# Patient Record
Sex: Female | Born: 1980 | Race: Black or African American | Hispanic: No | Marital: Single | Smoking: Current every day smoker
Health system: Southern US, Community
[De-identification: ages and names within clinical notes are randomized; demographics above are authoritative.]

---
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
## 1999-08-12 ENCOUNTER — Other Ambulatory Visit: Admission: RE | Admit: 1999-08-12 | Discharge: 1999-08-12 | Payer: Self-pay | Admitting: *Deleted

## 2000-01-31 ENCOUNTER — Inpatient Hospital Stay (HOSPITAL_COMMUNITY): Admission: AD | Admit: 2000-01-31 | Discharge: 2000-01-31 | Payer: Self-pay | Admitting: *Deleted

## 2000-02-24 ENCOUNTER — Inpatient Hospital Stay (HOSPITAL_COMMUNITY): Admission: AD | Admit: 2000-02-24 | Discharge: 2000-02-24 | Payer: Self-pay | Admitting: Pediatrics

## 2000-03-02 ENCOUNTER — Inpatient Hospital Stay (HOSPITAL_COMMUNITY): Admission: AD | Admit: 2000-03-02 | Discharge: 2000-03-04 | Payer: Self-pay | Admitting: Obstetrics and Gynecology

## 2001-03-09 ENCOUNTER — Emergency Department (HOSPITAL_COMMUNITY): Admission: EM | Admit: 2001-03-09 | Discharge: 2001-03-09 | Payer: Self-pay

## 2001-03-11 ENCOUNTER — Emergency Department (HOSPITAL_COMMUNITY): Admission: EM | Admit: 2001-03-11 | Discharge: 2001-03-11 | Payer: Self-pay

## 2001-03-13 ENCOUNTER — Emergency Department (HOSPITAL_COMMUNITY): Admission: EM | Admit: 2001-03-13 | Discharge: 2001-03-13 | Payer: Self-pay | Admitting: Emergency Medicine

## 2001-06-05 ENCOUNTER — Emergency Department (HOSPITAL_COMMUNITY): Admission: EM | Admit: 2001-06-05 | Discharge: 2001-06-05 | Payer: Self-pay | Admitting: Emergency Medicine

## 2003-01-25 ENCOUNTER — Emergency Department (HOSPITAL_COMMUNITY): Admission: EM | Admit: 2003-01-25 | Discharge: 2003-01-25 | Payer: Self-pay

## 2004-06-15 ENCOUNTER — Ambulatory Visit (HOSPITAL_BASED_OUTPATIENT_CLINIC_OR_DEPARTMENT_OTHER): Admission: RE | Admit: 2004-06-15 | Discharge: 2004-06-15 | Payer: Self-pay | Admitting: Ophthalmology

## 2007-03-20 ENCOUNTER — Ambulatory Visit (HOSPITAL_COMMUNITY): Admission: RE | Admit: 2007-03-20 | Discharge: 2007-03-20 | Payer: Self-pay | Admitting: Obstetrics & Gynecology

## 2007-06-29 ENCOUNTER — Ambulatory Visit (HOSPITAL_COMMUNITY): Admission: RE | Admit: 2007-06-29 | Discharge: 2007-06-29 | Payer: Self-pay | Admitting: Obstetrics & Gynecology

## 2007-08-18 ENCOUNTER — Inpatient Hospital Stay (HOSPITAL_COMMUNITY): Admission: AD | Admit: 2007-08-18 | Discharge: 2007-08-20 | Payer: Self-pay | Admitting: Obstetrics & Gynecology

## 2007-08-18 ENCOUNTER — Ambulatory Visit: Payer: Self-pay | Admitting: Gynecology

## 2007-08-19 ENCOUNTER — Encounter: Payer: Self-pay | Admitting: Obstetrics and Gynecology

## 2008-11-30 IMAGING — US US OB LIMITED
1 series · 14 of 28 positions shown · non-contrast
Comparison: none

OBSTETRICAL ULTRASOUND:

 This ultrasound exam was performed in the [HOSPITAL] Ultrasound Department.  The OB US report was generated in the AS system, and faxed to the ordering physician.  This report is also available in [REDACTED] PACS.

[Series 1: us ob limited · 0.30mm/px · 14 of 51 slices shown]
[im 2/51]
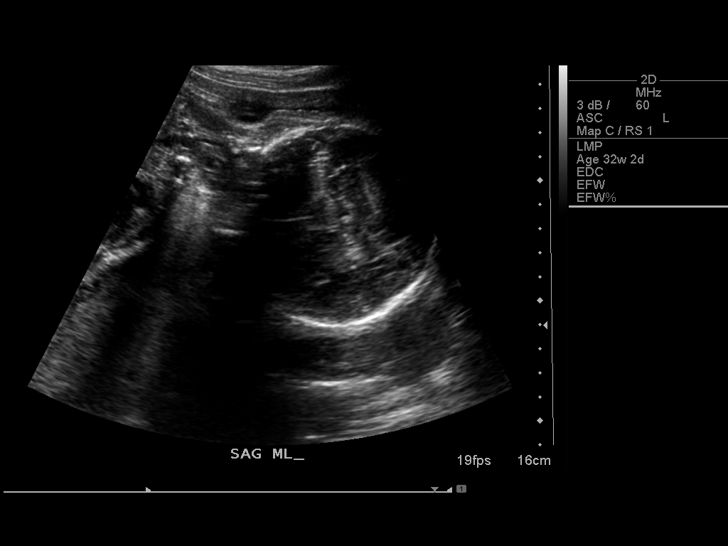
[im 6/51]
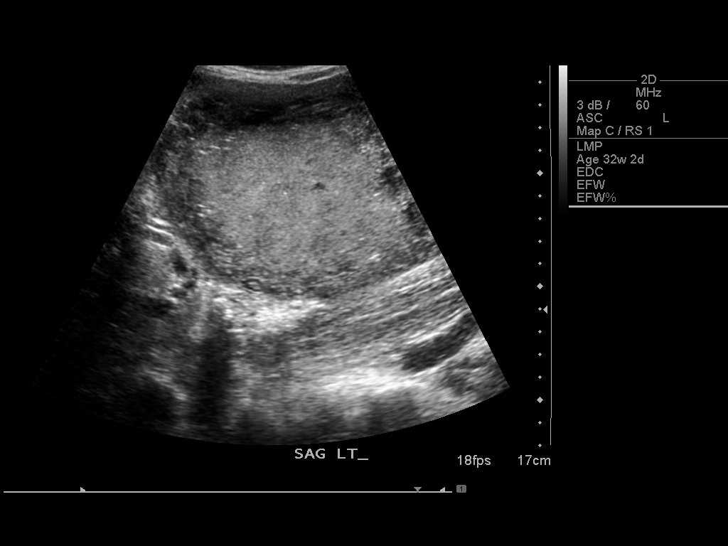
[im 10/51]
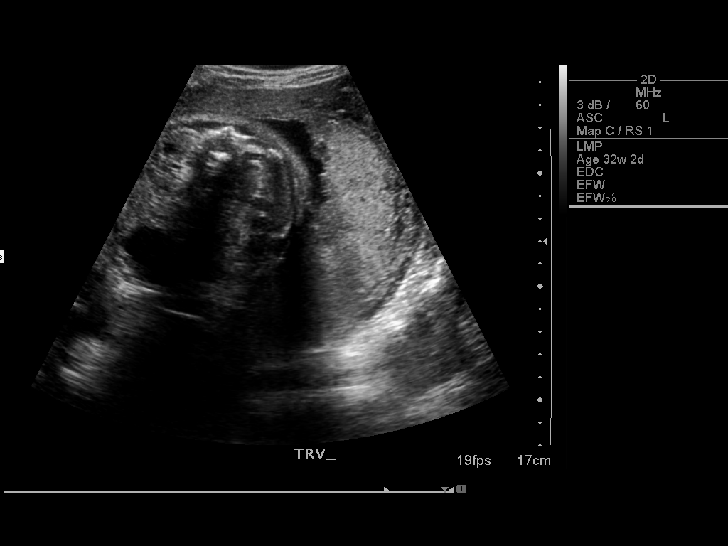
[im 13/51]
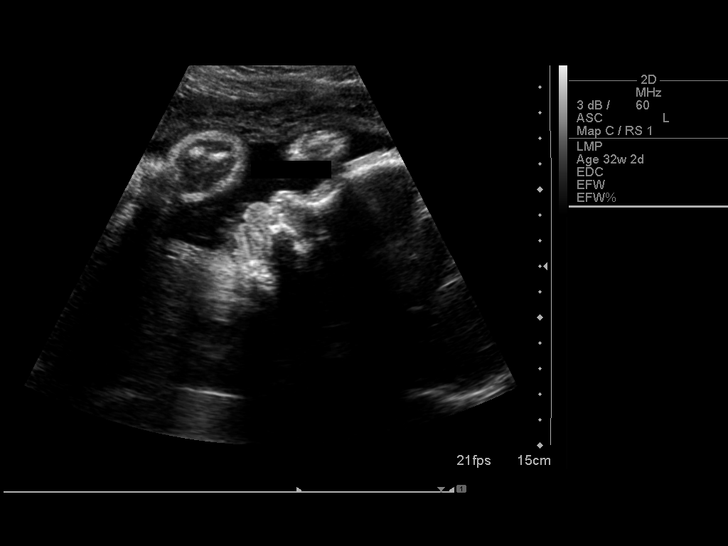
[im 17/51]
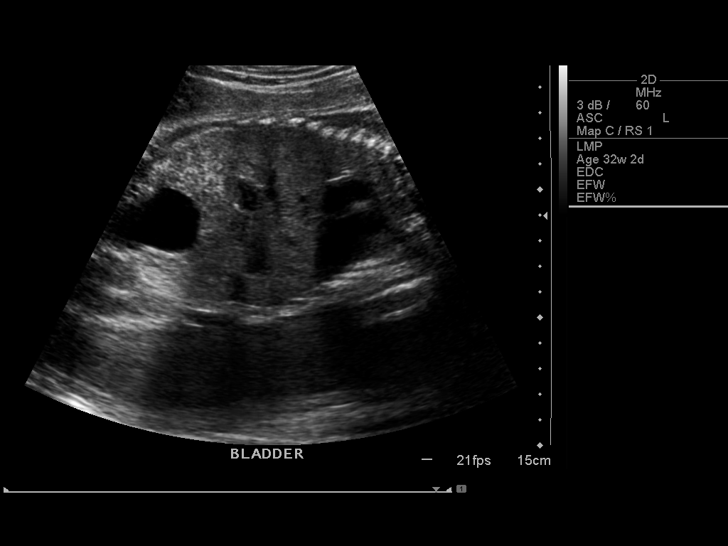
[im 21/51]
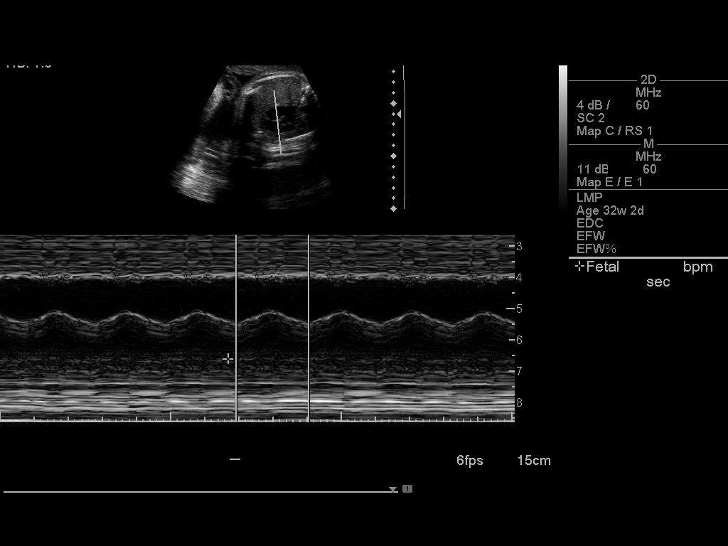
[im 25/51]
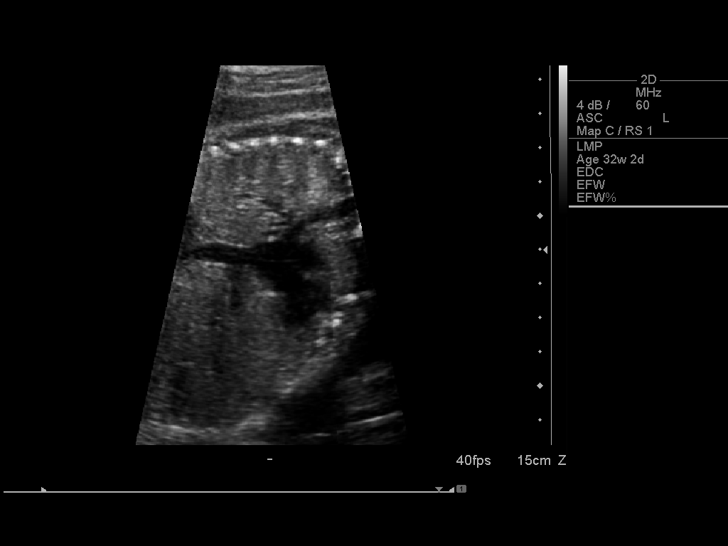
[im 28/51]
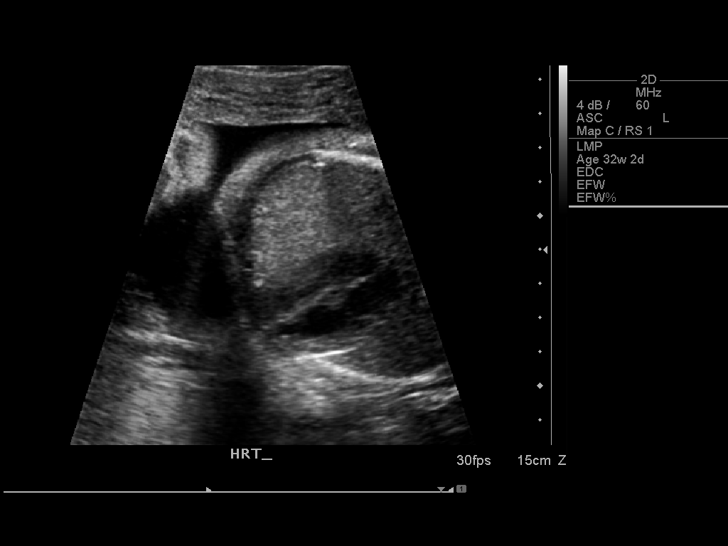
[im 32/51]
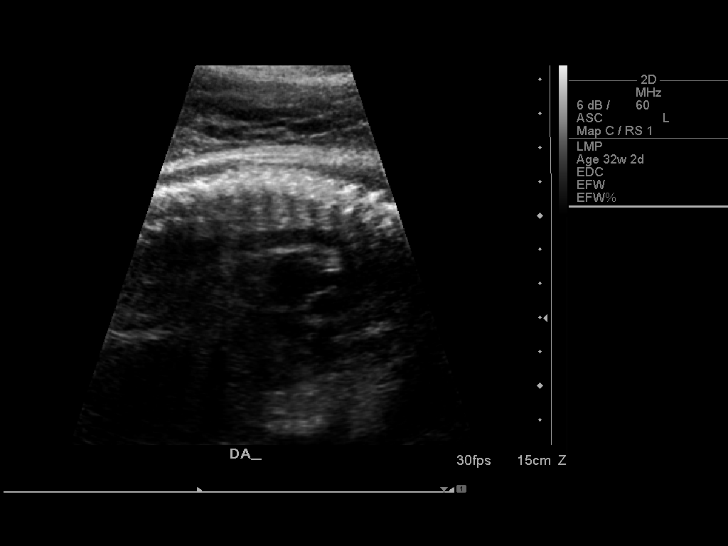
[im 36/51]
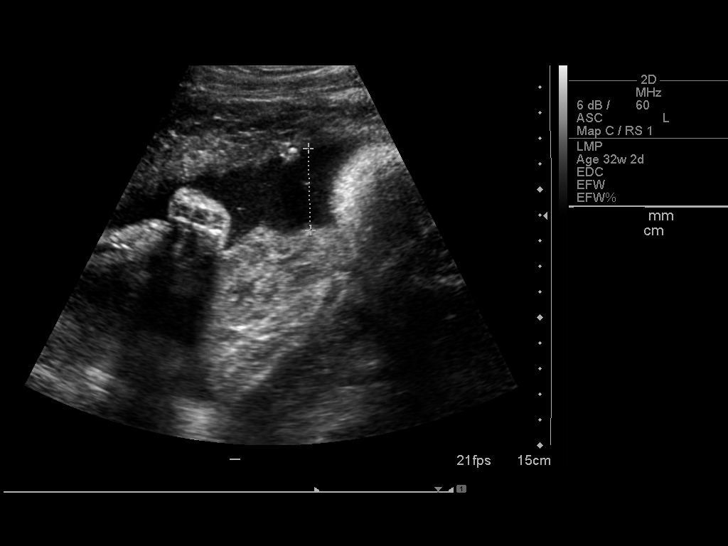
[im 39/51]
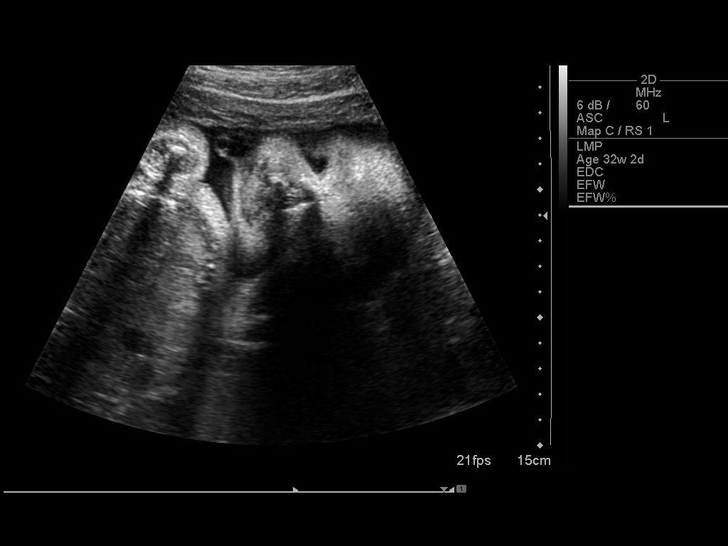
[im 43/51]
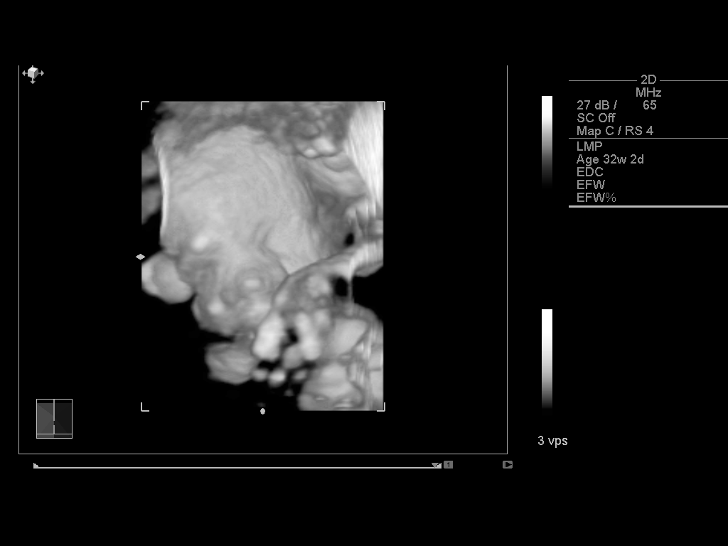
[im 47/51]
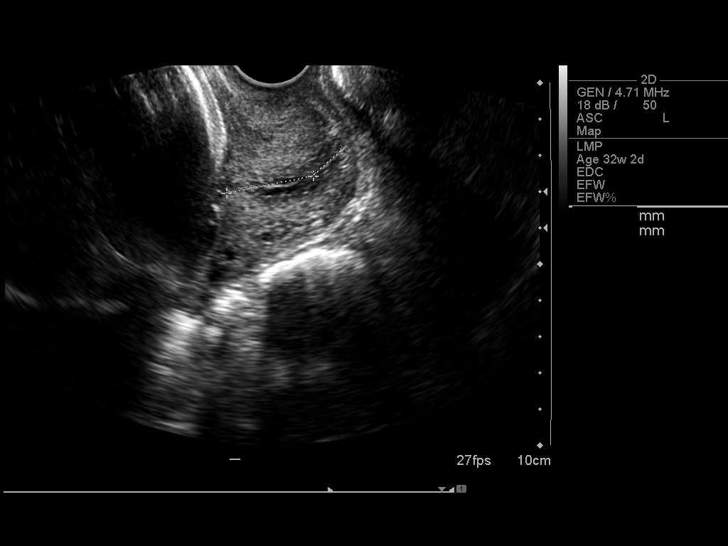
[im 51/51]
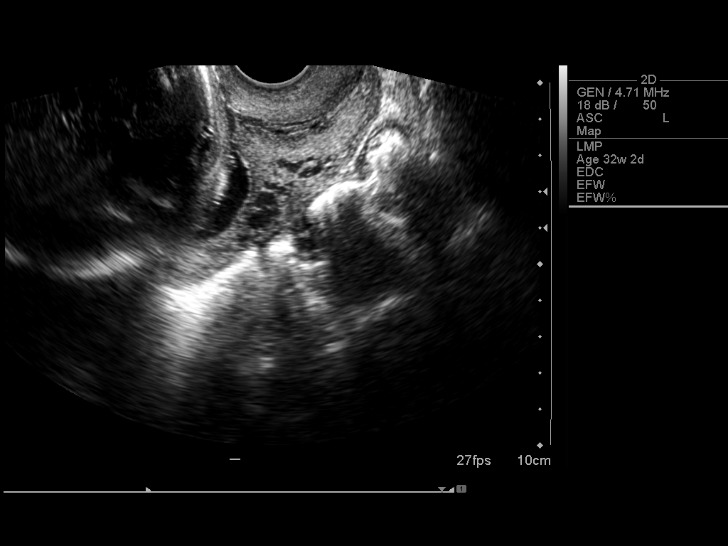

[14 of 28 positions shown; findings below may reference images not displayed]

IMPRESSION: See AS Obstetric US report.

## 2009-11-19 ENCOUNTER — Emergency Department (HOSPITAL_COMMUNITY): Admission: EM | Admit: 2009-11-19 | Discharge: 2009-11-19 | Payer: Self-pay | Admitting: Emergency Medicine

## 2011-01-11 NOTE — Op Note (Signed)
NAMEHUBERT, DERSTINE NO.:  [id number]   MEDICAL RECORD NO.:  [id number]          PATIENT TYPE:  INP   LOCATION:  9127                          FACILITY:  [facility]   PHYSICIAN:  Tilda Burrow, M.D. DATE OF BIRTH:  03-09-1981   DATE OF PROCEDURE:  DATE OF DISCHARGE:                               OPERATIVE REPORT   PREOPERATIVE DIAGNOSIS:  Elective postpartum sterilization.   POSTOPERATIVE DIAGNOSIS:  Elective postpartum sterilization.   PROCEDURE:  Postpartum bilateral partial salpingectomy, modified  [place]oy.   SURGEON:  Tilda Burrow, M.D.   ASSISTANT:  None.   ANESTHESIA:  Epidural.   COMPLICATIONS:  None.   FINDINGS:  Normal-appearing tubes identified to their fimbriated ends.   DETAILS OF THE PROCEDURE:  The patient was taken to the operating room,  prepped and draped in the usual fashion for abdominal procedure.  The  epidural catheter was used to reestablish epidural analgesia.  This  required a little more dose than usual because the patient had residual  sensation on the right side initially.  The epidural catheter was pulled  back 2 cm and then redosed with good analgesia.  We then proceeded with  the procedure.   An infraumbilical semicircular 2-cm incision was made through the skin  and subcutaneous tissue.  The fascia could be identified, elevated with  Allis clamp x2, and then opened sharply with Metzenbaum scissors while  carefully.  Preperitoneal fat was then opened bluntly and the abdomen  inspected with no suspicion of injury to internal organs during entry.  The operator's index finger could be reached in to identify the right  cornual area, and the proximal right fallopian tube was grasped with a  Babcock clamp and then the tube traced out serially until the fimbriated  end could be visually confirmed.  Mid segment knuckle of tube was then  grasped with a Babcock, doubly ligated with 2-0 chromic, and the mid  segment incarcerated  knuckle of tube excised.  Hemostasis was confirmed.  The left side was then treated in a similar fashion.  A small laparotomy  gauze tape was used to move the bowel out of way during this portion of  the procedure and a similar technique used on the left tube.  The  laparotomy tape was removed, the  sponge counts correct, the fascia closed with continuous running 0  Vicryl with the subcutaneous tissues reapproximated with interrupted  stitch of 4-0 Dexon and then subcuticular 4-0 Dexon skin closure.  The  patient tolerated the procedure well and went to recovery room in stable  condition.      Tilda Burrow, M.D.  Electronically Signed     JVF/MEDQ  D:  08/19/2007  T:  08/20/2007  Job:  [id number]

## 2011-01-14 NOTE — Op Note (Signed)
NAMEBRENTLEY, Carla Navarro            ACCOUNT NO.:  [id number]   MEDICAL RECORD NO.:  [id number]          PATIENT TYPE:  AMB   LOCATION:  DSC                          FACILITY:  [facility]   PHYSICIAN:  Salley Scarlet., M.D.DATE OF BIRTH:  1981/06/09   DATE OF PROCEDURE:  06/16/2004  DATE OF DISCHARGE:                                 OPERATIVE REPORT   PREOPERATIVE DIAGNOSIS:  Chalazion upper lid left eye.   POSTOPERATIVE DIAGNOSIS:  Chalazion upper lid left eye.   OPERATION:  Chalazion excision left eye.   ANESTHESIA:  Local using Xylocaine 1%.   PROCEDURE:  The upper lid of the left eye was inspected and there was a  large chalazion located along the lateral aspect of the upper lid of the  left eye.  The patient was prepped and draped in the usual manner.  The lid  was then infiltrated with several mL of Xylocaine.  The chalazion clamp was  applied over the lesion and the lesion was everted.  A cruciate incision was  made in the tarsus of the lesion and the lesion was curetted using the  chalazion curet.  The sac was excised in total using sharp and blunt  dissection.  Polysporin ophthalmic ointment and pressure patch was applied.  The patient tolerated the procedure well and was discharged to the post  anesthesia recovery room in satisfactory condition.  She is instructed to  keep the eye patched today, to take Vicodin every 4 hours as needed for  pain, to remove the patch tomorrow, to resume her drops four times a day,  and to see me in the office in one week.   DISCHARGE DIAGNOSIS:  Chalazion upper lid left eye.       TB/MEDQ  D:  06/16/2004  T:  06/16/2004  Job:  [id number]

## 2011-06-03 LAB — CBC
Hemoglobin: 12
RBC: 3.72 — ABNORMAL LOW
WBC: 9.6

## 2012-03-10 ENCOUNTER — Emergency Department (HOSPITAL_COMMUNITY): Payer: Medicaid Other

## 2012-03-10 ENCOUNTER — Encounter (HOSPITAL_COMMUNITY): Payer: Self-pay | Admitting: *Deleted

## 2012-03-10 ENCOUNTER — Emergency Department (HOSPITAL_COMMUNITY)
Admission: EM | Admit: 2012-03-10 | Discharge: 2012-03-10 | Disposition: A | Discharge status: Home / Self Care | Payer: Medicaid Other | Attending: Emergency Medicine | Admitting: Emergency Medicine

## 2012-03-10 DIAGNOSIS — S92901A Unspecified fracture of right foot, initial encounter for closed fracture: Secondary | ICD-10-CM

## 2012-03-10 DIAGNOSIS — S92309A Fracture of unspecified metatarsal bone(s), unspecified foot, initial encounter for closed fracture: Secondary | ICD-10-CM | POA: Insufficient documentation

## 2012-03-10 DIAGNOSIS — IMO0002 Reserved for concepts with insufficient information to code with codable children: Secondary | ICD-10-CM | POA: Insufficient documentation

## 2012-03-10 DIAGNOSIS — F17200 Nicotine dependence, unspecified, uncomplicated: Secondary | ICD-10-CM | POA: Insufficient documentation

## 2012-03-10 DIAGNOSIS — M79609 Pain in unspecified limb: Secondary | ICD-10-CM | POA: Insufficient documentation

## 2012-03-10 MED ORDER — OXYCODONE-ACETAMINOPHEN 5-325 MG PO TABS
1.0000 | ORAL_TABLET | Freq: Once | ORAL | Status: AC
  Administered 2012-03-10: 1 via ORAL
  Filled 2012-03-10: qty 1

## 2012-03-10 MED ORDER — OXYCODONE-ACETAMINOPHEN 5-325 MG PO TABS: 1.0000 | ORAL_TABLET | Freq: Four times a day (QID) | ORAL | Status: AC | PRN

## 2012-03-10 NOTE — ED Notes (Signed)
Ortho paged for post op shoe and crutches   

## 2012-03-10 NOTE — Progress Notes (Signed)
Orthopedic Tech Progress Note Patient Details:  Carla Navarro 1981-06-06 [id number]  Ortho Devices Type of Ortho Device: Crutches;Postop boot Ortho Device/Splint Location: (R) LE Ortho Device/Splint Interventions: Ordered;Application   Jennye Moccasin 03/10/2012, 3:13 PM
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
## 2012-07-16 ENCOUNTER — Emergency Department (HOSPITAL_COMMUNITY)
Admission: EM | Admit: 2012-07-16 | Discharge: 2012-07-16 | Discharge status: Against Medical Advice | Payer: Medicaid Other | Attending: Emergency Medicine | Admitting: Emergency Medicine

## 2012-07-16 ENCOUNTER — Emergency Department (HOSPITAL_COMMUNITY)
Admission: EM | Admit: 2012-07-16 | Discharge: 2012-07-17 | Disposition: A | Discharge status: Home / Self Care | Payer: Self-pay | Attending: Emergency Medicine | Admitting: Emergency Medicine

## 2012-07-16 ENCOUNTER — Emergency Department (HOSPITAL_COMMUNITY): Payer: Self-pay

## 2012-07-16 ENCOUNTER — Encounter (HOSPITAL_COMMUNITY): Payer: Self-pay | Admitting: Emergency Medicine

## 2012-07-16 DIAGNOSIS — J36 Peritonsillar abscess: Secondary | ICD-10-CM | POA: Insufficient documentation

## 2012-07-16 DIAGNOSIS — R509 Fever, unspecified: Secondary | ICD-10-CM | POA: Insufficient documentation

## 2012-07-16 DIAGNOSIS — J029 Acute pharyngitis, unspecified: Secondary | ICD-10-CM | POA: Insufficient documentation

## 2012-07-16 DIAGNOSIS — F17200 Nicotine dependence, unspecified, uncomplicated: Secondary | ICD-10-CM | POA: Insufficient documentation

## 2012-07-16 LAB — CBC WITH DIFFERENTIAL/PLATELET
Basophils Absolute: 0 10*3/uL (ref 0.0–0.1)
HCT: 38.6 % (ref 36.0–46.0)
Hemoglobin: 13.3 g/dL (ref 12.0–15.0)
Lymphocytes Relative: 23 % (ref 12–46)
Monocytes Absolute: 1.5 10*3/uL — ABNORMAL HIGH (ref 0.1–1.0)
Monocytes Relative: 11 % (ref 3–12)
Neutro Abs: 8.9 10*3/uL — ABNORMAL HIGH (ref 1.7–7.7)
WBC: 14 10*3/uL — ABNORMAL HIGH (ref 4.0–10.5)

## 2012-07-16 LAB — BASIC METABOLIC PANEL
CO2: 22 mEq/L (ref 19–32)
Chloride: 101 mEq/L (ref 96–112)
Creatinine, Ser: 0.65 mg/dL (ref 0.50–1.10)

## 2012-07-16 LAB — RAPID STREP SCREEN (MED CTR MEBANE ONLY): Streptococcus, Group A Screen (Direct): POSITIVE — AB

## 2012-07-16 MED ORDER — CLINDAMYCIN PHOSPHATE 600 MG/50ML IV SOLN
600.0000 mg | Freq: Once | INTRAVENOUS | Status: AC
  Administered 2012-07-16: 600 mg via INTRAVENOUS
  Filled 2012-07-16: qty 50

## 2012-07-16 MED ORDER — IOHEXOL 300 MG/ML  SOLN: 75.0000 mL | Freq: Once | INTRAMUSCULAR | Status: AC | PRN

## 2012-07-16 MED ORDER — KETOROLAC TROMETHAMINE 30 MG/ML IJ SOLN
30.0000 mg | Freq: Once | INTRAMUSCULAR | Status: AC
  Administered 2012-07-16: 30 mg via INTRAVENOUS
  Filled 2012-07-16: qty 1

## 2012-07-16 MED ORDER — SODIUM CHLORIDE 0.9 % IV BOLUS (SEPSIS)
1000.0000 mL | Freq: Once | INTRAVENOUS | Status: AC
  Administered 2012-07-16: 1000 mL via INTRAVENOUS

## 2012-07-16 MED ORDER — DEXAMETHASONE SODIUM PHOSPHATE 10 MG/ML IJ SOLN
10.0000 mg | Freq: Once | INTRAMUSCULAR | Status: AC
  Administered 2012-07-16: 10 mg via INTRAVENOUS
  Filled 2012-07-16: qty 1

## 2012-07-16 NOTE — ED Notes (Signed)
Unable to locate pt x2

## 2012-07-16 NOTE — ED Notes (Signed)
Unable to locate the pt x 3 

## 2012-07-16 NOTE — ED Provider Notes (Signed)
History   This chart was scribed for Loren Racer, MD by Gerlean Ren, ED Scribe. This patient was seen in room TR06C/TR06C and the patient's care was started at 9:12 PM    CSN: [id number]  Arrival date & time 07/16/12  5784   First MD Initiated Contact with Patient 07/16/12 2106      Chief Complaint  Patient presents with  . Sore Throat     The history is provided by the patient. No language interpreter was used.   Carla Navarro is a 31 y.o. female who presents to the Emergency Department complaining of one week of sore throat with associated painful and difficult swallowing, fever, and chills.  Pt denies rhinorrhea and abdominal pain.  Pt was seen here this morning and left AMA but had positive strep test.  Pt has previously had strep throat when young.  Pt is a current everyday smoker but denies alcohol use.   No past medical history on file.  No past surgical history on file.  No family history on file.  History  Substance Use Topics  . Smoking status: Current Every Day Smoker    Types: Cigarettes  . Smokeless tobacco: Not on file  . Alcohol Use: No    No OB history provided.  Review of Systems  Constitutional: Positive for fever and chills.  HENT: Positive for sore throat. Negative for rhinorrhea.   Respiratory: Negative for shortness of breath.   Gastrointestinal: Negative for abdominal pain.    Allergies  Review of patient's allergies indicates no known allergies.  Home Medications   Current Outpatient Rx  Name  Route  Sig  Dispense  Refill  . CLINDAMYCIN HCL 150 MG PO CAPS   Oral   Take 2 capsules (300 mg total) by mouth every 6 (six) hours.   40 capsule   0   . IBUPROFEN 600 MG PO TABS   Oral   Take 1 tablet (600 mg total) by mouth every 6 (six) hours as needed for pain.   30 tablet   0     BP 167/99  Pulse 89  Temp 99.3 F (37.4 C) (Oral)  Resp 16  SpO2 100%  Physical Exam  Nursing note and vitals reviewed. Constitutional: She  is oriented to person, place, and time. She appears well-developed and well-nourished.  HENT:  Head: Normocephalic and atraumatic.       Erythematous oropharynx with no evidence of abscess. Uvula midline.  No tonsillar exudate.  Mild trismus.  Eyes: Conjunctivae normal and EOM are normal. Pupils are equal, round, and reactive to light.  Neck: Normal range of motion.       Submandibular firmness and fullness bilaterally, very tender to palpation.  Cardiovascular: Normal rate, regular rhythm and normal heart sounds.   Pulmonary/Chest: Effort normal and breath sounds normal.  Abdominal: Soft. Bowel sounds are normal.  Musculoskeletal: Normal range of motion.  Neurological: She is alert and oriented to person, place, and time.  Skin: Skin is warm and dry.  Psychiatric: She has a normal mood and affect.    ED Course  Procedures (including critical care time) DIAGNOSTIC STUDIES: Oxygen Saturation is 100% on room air, normal by my interpretation.    COORDINATION OF CARE: 9:16 PM- Patient informed of clinical course, understands medical decision-making process, and agrees with plan.  Ordered IV fluids, IV cleocin, decadron, Toradol, CBC, b-met, and soft tissue neck CT with contrast.      Labs Reviewed  CBC WITH DIFFERENTIAL - Abnormal;  Notable for the following:    WBC 14.0 (*)     Neutro Abs 8.9 (*)     Monocytes Absolute 1.5 (*)     All other components within normal limits  BASIC METABOLIC PANEL   Results for orders placed during the hospital encounter of 07/16/12  CBC WITH DIFFERENTIAL      Component Value Range   WBC 14.0 (*) 4.0 - 10.5 K/uL   RBC 4.46  3.87 - 5.11 MIL/uL   Hemoglobin 13.3  12.0 - 15.0 g/dL   HCT 16.1  09.6 - 04.5 %   MCV 86.5  78.0 - 100.0 fL   MCH 29.8  26.0 - 34.0 pg   MCHC 34.5  30.0 - 36.0 g/dL   RDW 40.9  81.1 - 91.4 %   Platelets 328  150 - 400 K/uL   Neutrophils Relative 64  43 - 77 %   Neutro Abs 8.9 (*) 1.7 - 7.7 K/uL   Lymphocytes Relative  23  12 - 46 %   Lymphs Abs 3.2  0.7 - 4.0 K/uL   Monocytes Relative 11  3 - 12 %   Monocytes Absolute 1.5 (*) 0.1 - 1.0 K/uL   Eosinophils Relative 3  0 - 5 %   Eosinophils Absolute 0.4  0.0 - 0.7 K/uL   Basophils Relative 0  0 - 1 %   Basophils Absolute 0.0  0.0 - 0.1 K/uL  BASIC METABOLIC PANEL      Component Value Range   Sodium 135  135 - 145 mEq/L   Potassium 3.5  3.5 - 5.1 mEq/L   Chloride 101  96 - 112 mEq/L   CO2 22  19 - 32 mEq/L   Glucose, Bld 89  70 - 99 mg/dL   BUN 8  6 - 23 mg/dL   Creatinine, Ser 7.82  0.50 - 1.10 mg/dL   Calcium 9.1  8.4 - 95.6 mg/dL   GFR calc non Af Amer >90  >90 mL/min   GFR calc Af Amer >90  >90 mL/min    Ct Soft Tissue Neck W Contrast  07/17/2012  *RADIOLOGY REPORT*  Clinical Data: Sore throat and cold symptoms for 1 week, hurts to swallow, positive strep  CT NECK WITH CONTRAST  Technique:  Multidetector CT imaging of the neck was performed with intravenous contrast. Sagittal and coronal MPR images reconstructed from axial data set.  Contrast: 75mL OMNIPAQUE IOHEXOL 300 MG/ML  SOLN  Comparison: None  Findings: Lung apices clear. Small cyst likely of dental origin at the left maxilla 1.5 x 1.5 x 1.7 cm image 19. No additional osseous abnormalities. Visualized intracranial structures unremarkable. Soft tissue edema identified in the left parapharyngeal space with a focal fluid collection with enhancing margins measuring 1.4 x 1.5 x 1.6 cm compatible with a peritonsillar abscess. Mild mass effect and effacement upon the oropharynx left to right. Prominent parapharyngeal lymphoid tissue.  Scattered beam hardening artifacts from dental hardware. Scattered enlarged cervical lymph nodes bilaterally largest of which is 13 mm in short axis left image 27. Symmetric appearing parotid and submandibular glands. Asymmetric thyroid lobes right larger than left without discrete nodule. SI structures grossly patent. Prevertebral soft tissues normal thickness.  IMPRESSION:  Left peritonsillar abscess 1.4 x 1.5 x 1.6 cm with surrounding edema. Enlarged bilateral cervical lymph nodes question reactive.  Findings called to Lonzo Cloud in [facility] ED triage on 07/17/2012 at 0025 hours.   Original Report Authenticated By: Ulyses Southward, M.D.  1. Peritonsillar abscess       MDM  I personally performed the services described in this documentation, which was scribed in my presence. The recorded information has been reviewed and is accurate.  Discussed with Dr Jenne Pane. Advised D/c home and call after 9 am to be seen today in office.   Pt states she is feeling much better after fluids and meds. Understands instructions and need to return for worsening symptoms or concerns      Loren Racer, MD 07/17/12 [phone]

## 2012-07-16 NOTE — ED Notes (Signed)
Sore throat and cold s/s  X 1 week hurts to swollow she states

## 2012-07-16 NOTE — ED Notes (Signed)
Here earlier pt had to leave to pick up children. Has positive strep test.

## 2012-07-16 NOTE — ED Notes (Signed)
Called for pt x 1 with no answer 

## 2012-07-17 MED ORDER — CLINDAMYCIN HCL 150 MG PO CAPS: 300.0000 mg | ORAL_CAPSULE | Freq: Four times a day (QID) | ORAL | Status: DC

## 2012-07-17 MED ORDER — IBUPROFEN 600 MG PO TABS: 600.0000 mg | ORAL_TABLET | Freq: Four times a day (QID) | ORAL | Status: DC | PRN

## 2012-07-17 MED ORDER — IOHEXOL 300 MG/ML  SOLN
75.0000 mL | Freq: Once | INTRAMUSCULAR | Status: AC | PRN
  Administered 2012-07-17: 75 mL via INTRAVENOUS

## 2012-07-17 NOTE — ED Notes (Signed)
Dr. Ranae Palms notified of CT results called to triage RN by radiologist.

## 2013-11-24 ENCOUNTER — Encounter (HOSPITAL_COMMUNITY): Payer: Self-pay | Admitting: Emergency Medicine

## 2013-11-24 ENCOUNTER — Emergency Department (HOSPITAL_COMMUNITY)
Admission: EM | Admit: 2013-11-24 | Discharge: 2013-11-24 | Disposition: A | Discharge status: Home / Self Care | Payer: Medicaid Other | Attending: Emergency Medicine | Admitting: Emergency Medicine

## 2013-11-24 DIAGNOSIS — F17200 Nicotine dependence, unspecified, uncomplicated: Secondary | ICD-10-CM | POA: Insufficient documentation

## 2013-11-24 DIAGNOSIS — L03116 Cellulitis of left lower limb: Secondary | ICD-10-CM

## 2013-11-24 DIAGNOSIS — R21 Rash and other nonspecific skin eruption: Secondary | ICD-10-CM

## 2013-11-24 DIAGNOSIS — L02619 Cutaneous abscess of unspecified foot: Secondary | ICD-10-CM | POA: Insufficient documentation

## 2013-11-24 DIAGNOSIS — L03119 Cellulitis of unspecified part of limb: Principal | ICD-10-CM

## 2013-11-24 MED ORDER — CEPHALEXIN 500 MG PO CAPS: 500.0000 mg | ORAL_CAPSULE | Freq: Four times a day (QID) | ORAL | Status: AC

## 2013-11-24 MED ORDER — HYDROCODONE-ACETAMINOPHEN 5-325 MG PO TABS: 1.0000 | ORAL_TABLET | Freq: Four times a day (QID) | ORAL | Status: AC | PRN

## 2013-11-24 MED ORDER — SULFAMETHOXAZOLE-TRIMETHOPRIM 800-160 MG PO TABS: 1.0000 | ORAL_TABLET | Freq: Two times a day (BID) | ORAL | Status: AC

## 2013-11-24 NOTE — ED Notes (Signed)
Onset 4 nights ago pt woke up suddenly to pain on lateral side of left foot.  1" x .5" cluster of raised red bumps.  Around site is red and painful.  Pt has red 1/2 dollar size red area on medial side of left shin, painful, no itching.  Rash is not getting worse, swelling to foot has decreased since bite.  At onset of bite pt did find spider in corner of room at ceiling.  No fevers.

## 2013-11-24 NOTE — ED Provider Notes (Signed)
Medical screening examination/treatment/procedure(s) were performed by non-physician practitioner and as supervising physician I was immediately available for consultation/collaboration.   EKG Interpretation None       Doug SouSam Nimo Verastegui, MD 11/24/13 [phone]

## 2013-11-24 NOTE — ED Notes (Signed)
Pt reports possible insect bite to left foot x 4 days ago, swelling noted to left foot, blisters noted to site, reports itching.

## 2013-11-24 NOTE — ED Provider Notes (Signed)
CSN: [id number]     Arrival date & time 11/24/13  1208 History  This chart was scribed for non-physician practitioner, Fayrene HelperBowie Pasty Manninen, PA-C working with Doug SouSam Jacubowitz, MD by Greggory StallionKayla Andersen, ED scribe. This patient was seen in room TR07C/TR07C and the patient's care was started at 12:51 PM.   Chief Complaint  Patient presents with  . Insect Bite   The history is provided by the patient. No language interpreter was used.   HPI Comments: De NurseCandice D Navarro is a 33 y.o. female who presents to the Emergency Department complaining of a possible insect bite to her left foot that she noticed 4 days ago. Pt states itching started first then she noticed worsening swelling and pain the next day. She states the swelling has started to improve. Pt has used rubbing alcohol, neosporin and taken tylenol with some relief. Denies new pets, detergents, changes in activities. Denies fever, chills, abdominal pain, nausea, emesis.   History reviewed. No pertinent past medical history. History reviewed. No pertinent past surgical history. History reviewed. No pertinent family history. History  Substance Use Topics  . Smoking status: Current Every Day Smoker    Types: Cigarettes  . Smokeless tobacco: Not on file  . Alcohol Use: No   OB History   Grav Para Term Preterm Abortions TAB SAB Ect Mult Living                 Review of Systems  Constitutional: Negative for fever and chills.  Gastrointestinal: Negative for nausea, vomiting and abdominal pain.  Skin: Positive for rash.       Insect bite.  All other systems reviewed and are negative.   Allergies  Review of patient's allergies indicates no known allergies.  Home Medications   Current Outpatient Rx  Name  Route  Sig  Dispense  Refill  . clindamycin (CLEOCIN) 150 MG capsule   Oral   Take 2 capsules (300 mg total) by mouth every 6 (six) hours.   40 capsule   0   . ibuprofen (ADVIL,MOTRIN) 600 MG tablet   Oral   Take 1 tablet (600 mg total) by  mouth every 6 (six) hours as needed for pain.   30 tablet   0    BP 125/70  Pulse 95  Temp(Src) 97.8 F (36.6 C) (Oral)  Resp 16  Ht 5\' 3"  (1.6 m)  Wt 134 lb 11.2 oz (61.1 kg)  BMI 23.87 kg/m2  SpO2 99%  LMP 11/17/2013  Physical Exam  Nursing note and vitals reviewed. Constitutional: She is oriented to person, place, and time. She appears well-developed and well-nourished. No distress.  HENT:  Head: Normocephalic and atraumatic.  Mouth/Throat: Oropharynx is clear and moist.  No mucosal lesion  Eyes: EOM are normal.  Neck: Neck supple. No tracheal deviation present.  Cardiovascular: Normal rate, regular rhythm and normal heart sounds.  Exam reveals no gallop and no friction rub.   No murmur heard. Pulmonary/Chest: Effort normal and breath sounds normal. No respiratory distress. She has no wheezes. She has no rhonchi. She has no rales.  Musculoskeletal: Normal range of motion.  Neurological: She is alert and oriented to person, place, and time.  Skin: Skin is warm and dry. Rash (Left foot has small groups of vesicles measuring about 1-2 mm, convalescent together, roughly the size of an almond, noted to the dorsum of the left foot laterally. Surrounding erythema tracking down to dorsum of distal foot with tenderness on palpation ) noted.  Psychiatric: She has a  normal mood and affect. Her behavior is normal.    ED Course  Procedures (including critical care time)  DIAGNOSTIC STUDIES: Oxygen Saturation is 99% on RA, normal by my interpretation.    COORDINATION OF CARE: 12:56 PM-Discussed treatment plan which includes an antibiotic with pt at bedside and pt agreed to plan. Advised pt to return to the ED if she develops worsening symptoms.   convalescing pustular blister with cellulitis noted to L lower foot and lower leg.  NO ankle joint involvement.  No obvious abscess.  Will give keflex/bactrim along with pain med.  Pt recommend returning in 2 days for wound recheck. No  systemic involvement.    Labs Review Labs Reviewed - No data to display Imaging Review No results found.   EKG Interpretation None      MDM   Final diagnoses:  Blistering rash  Cellulitis of left foot excluding toes    BP 125/70  Pulse 95  Temp(Src) 97.8 F (36.6 C) (Oral)  Resp 16  Ht 5\' 3"  (1.6 m)  Wt 134 lb 11.2 oz (61.1 kg)  BMI 23.87 kg/m2  SpO2 99%  LMP 11/17/2013   I personally performed the services described in this documentation, which was scribed in my presence. The recorded information has been reviewed and is accurate.    Fayrene Helper, PA-C 11/24/13 1339

## 2013-11-24 NOTE — Discharge Instructions (Signed)
You have been evaluated for your rash.  It appears that you have developed an infection.  Take antibiotics for the full duration.  If after 2 days and you notice no improvement then please follow up with your doctor or return for further care.  You may apply over the counter neosporin twice daily to rash.     Cellulitis Cellulitis is an infection of the skin and the tissue under the skin. The infected area is usually red and tender. This happens most often in the arms and lower legs. HOME CARE   Take your antibiotic medicine as told. Finish the medicine even if you start to feel better.  Keep the infected arm or leg raised (elevated).  Put a warm cloth on the area up to 4 times per day.  Only take medicines as told by your doctor.  Keep all doctor visits as told. GET HELP RIGHT AWAY IF:   You have a fever.  You feel very sleepy.  You throw up (vomit) or have watery poop (diarrhea).  You feel sick and have muscle aches and pains.  You see red streaks on the skin coming from the infected area.  Your red area gets bigger or turns a dark color.  Your bone or joint under the infected area is painful after the skin heals.  Your infection comes back in the same area or different area.  You have a puffy (swollen) bump in the infected area.  You have new symptoms. MAKE SURE YOU:   Understand these instructions.  Will watch your condition.  Will get help right away if you are not doing well or get worse. Document Released: 02/01/2008 Document Revised: 02/14/2012 Document Reviewed: 10/31/2011 [facility] Patient Information 2014 [employer], [place].
